# Patient Record
Sex: Male | Born: 1995 | Race: White | Hispanic: No | Marital: Single | Smoking: Never smoker
Health system: Southern US, Community
[De-identification: ages and names within clinical notes are randomized; demographics above are authoritative.]

## PROBLEM LIST (undated history)

## (undated) DIAGNOSIS — F329 Major depressive disorder, single episode, unspecified: Secondary | ICD-10-CM

## (undated) DIAGNOSIS — F32A Depression, unspecified: Secondary | ICD-10-CM

## (undated) DIAGNOSIS — Z789 Other specified health status: Secondary | ICD-10-CM

## (undated) DIAGNOSIS — F99 Mental disorder, not otherwise specified: Secondary | ICD-10-CM

---
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
## 2015-02-16 IMAGING — CT CT HEAD W/O CM
1 series · 16 of 30 positions shown, 20 images · non-contrast
Comparison: None.

CLINICAL DATA: Depression.  Migraine headaches.

EXAM:
CT HEAD WITHOUT CONTRAST
TECHNIQUE: Contiguous axial images were obtained from the base of the skull
through the vertex without intravenous contrast.

[Series 2: headseq 4.8 h45s · axial · 0.43mm/px · z∈[-138,+17]mm · 16 of 36 slices shown, 20 images]
[im 2/36  brain]
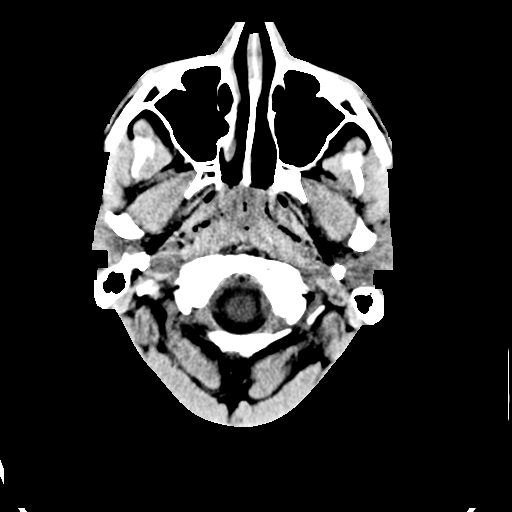
[im 2/36  bone]
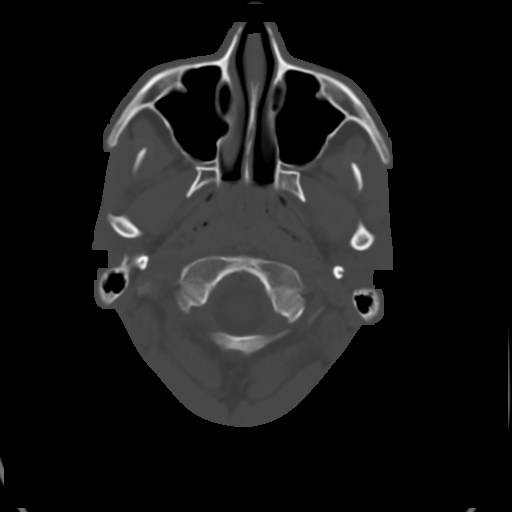
[im 4/36  brain]
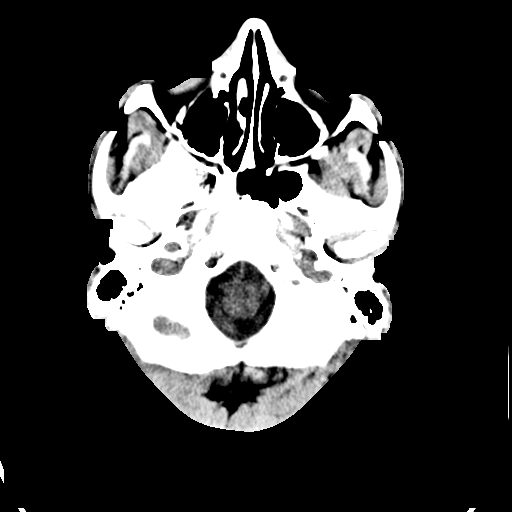
[im 7/36  brain]
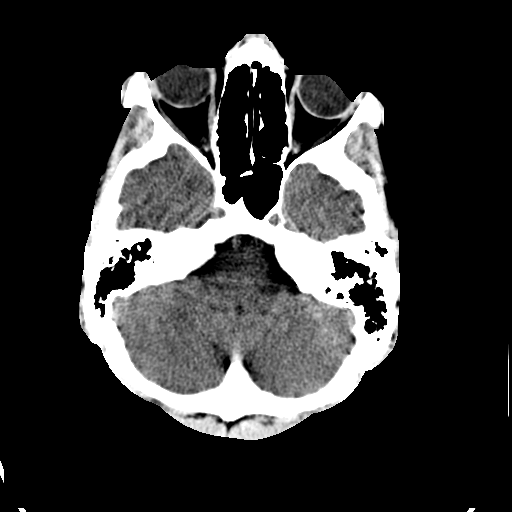
[im 9/36  brain]
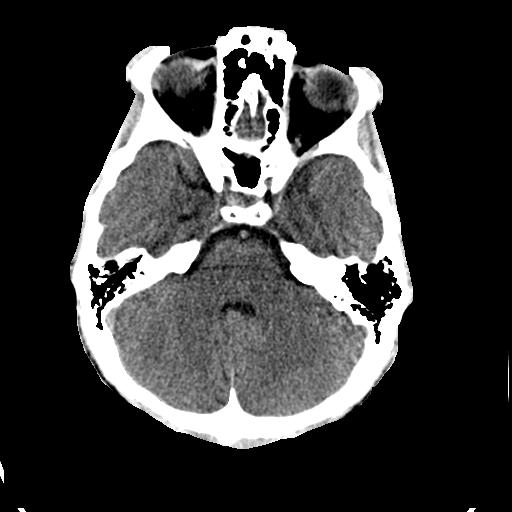
[im 10/36  brain]
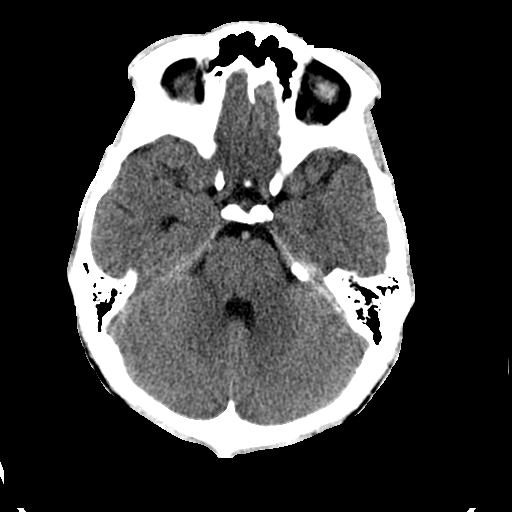
[im 10/36  bone]
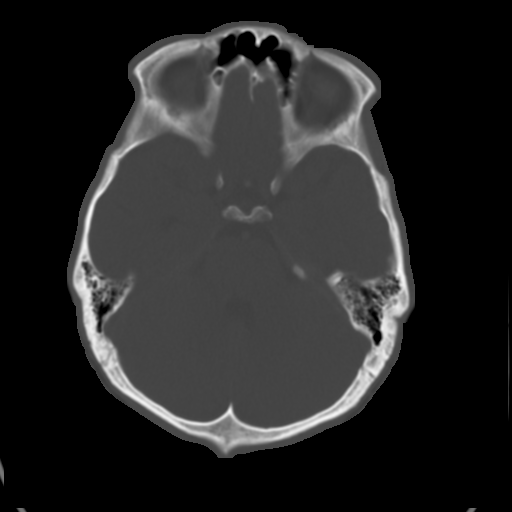
[im 13/36  brain]
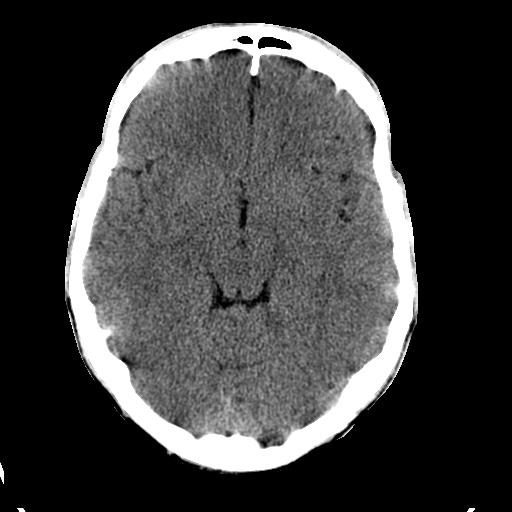
[im 15/36  brain]
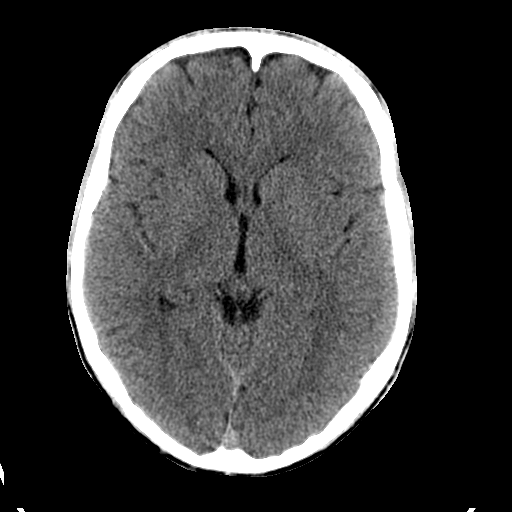
[im 17/36  brain]
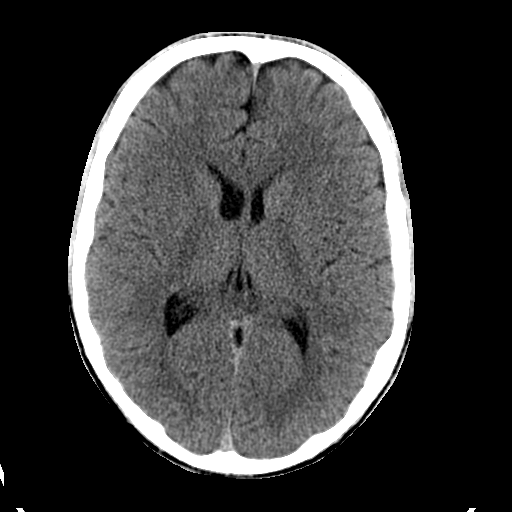
[im 19/36  brain]
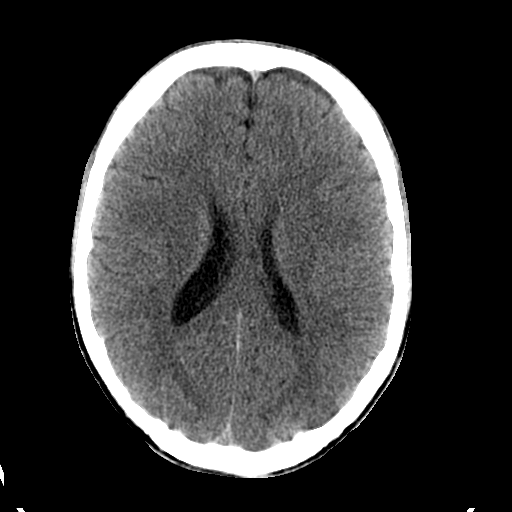
[im 19/36  bone]
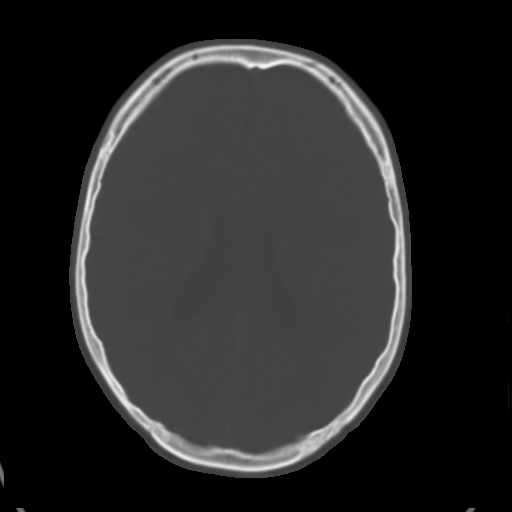
[im 21/36  brain]
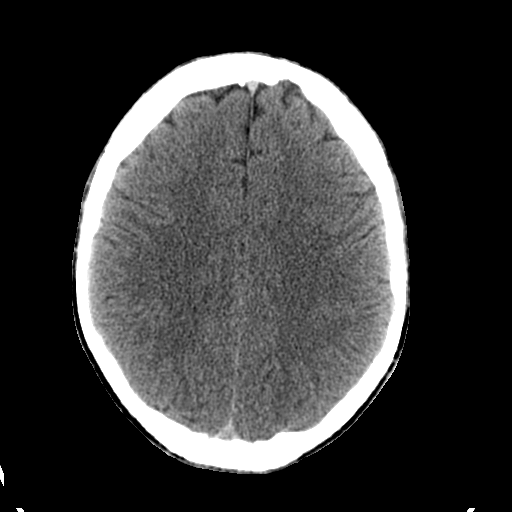
[im 23/36  brain]
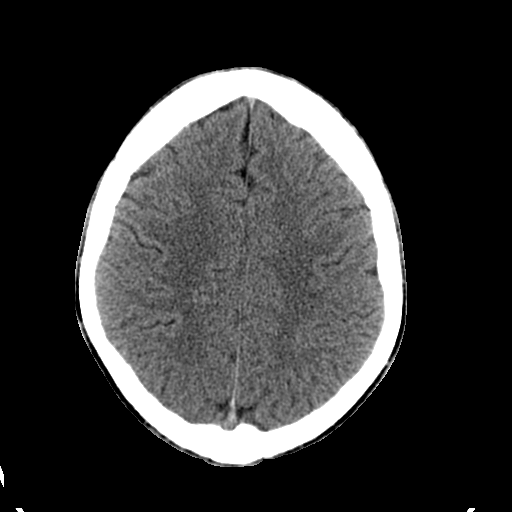
[im 26/36  brain]
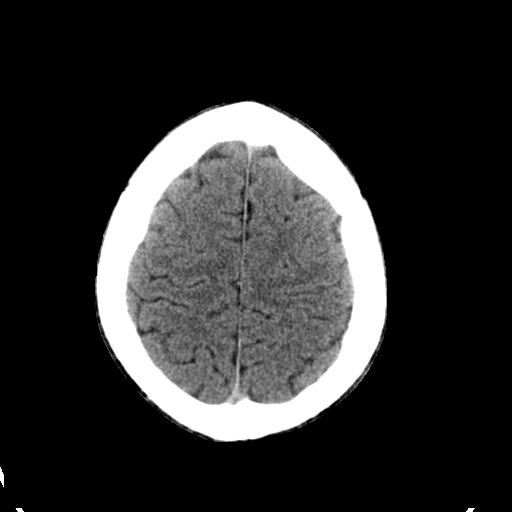
[im 27/36  brain]
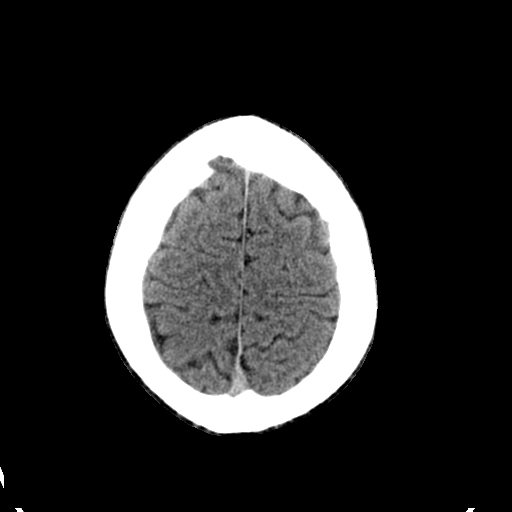
[im 27/36  bone]
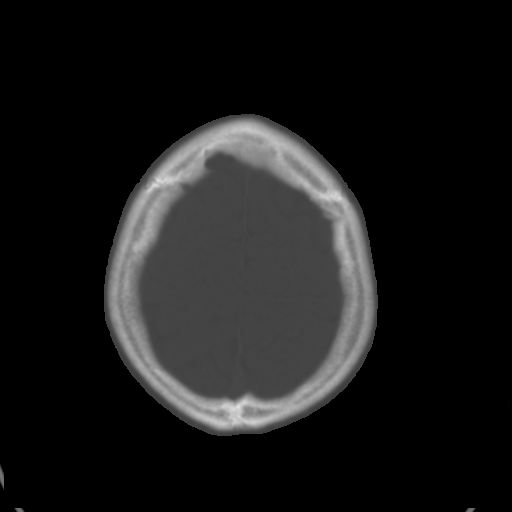
[im 29/36  brain]
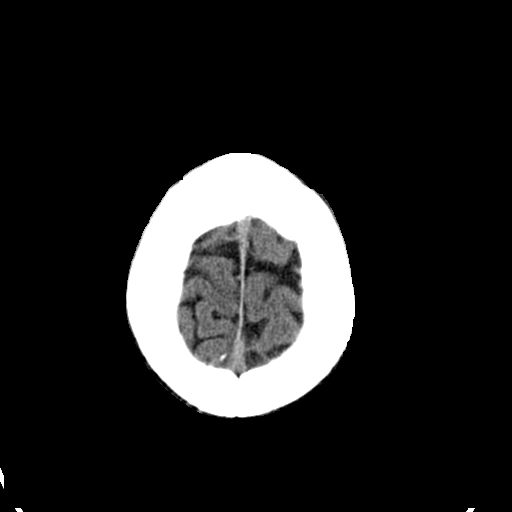
[im 32/36  brain]
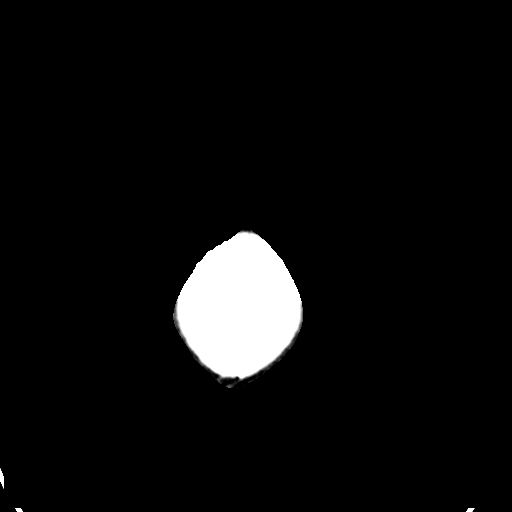
[im 34/36  brain]
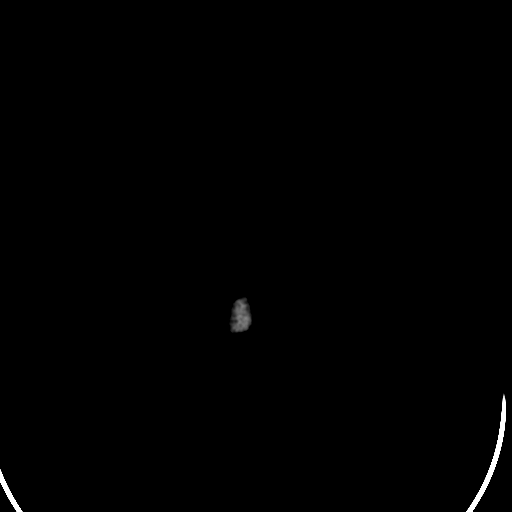

[16 of 30 positions shown; findings below may reference images not displayed]

FINDINGS: Ventricle size is normal. Negative for acute or chronic infarction.
Negative for hemorrhage or fluid collection. Negative for mass or
edema. No shift of the midline structures.

Calvarium is intact.
IMPRESSION: Normal
